# Patient Record
Sex: Male | Born: 1975 | Race: Black or African American | Hispanic: No | Marital: Single | Smoking: Former smoker
Health system: Southern US, Community
[De-identification: ages and names within clinical notes are randomized; demographics above are authoritative.]

## PROBLEM LIST (undated history)

## (undated) HISTORY — PX: FINGER FRACTURE SURGERY: SHX638

---

## 2012-08-28 ENCOUNTER — Other Ambulatory Visit: Payer: Self-pay | Admitting: Occupational Medicine

## 2012-08-28 ENCOUNTER — Ambulatory Visit: Payer: Self-pay

## 2012-08-28 DIAGNOSIS — R52 Pain, unspecified: Secondary | ICD-10-CM

## 2013-09-17 IMAGING — CR DG HAND COMPLETE 3+V*L*
3 series · 3 of 3 positions shown · non-contrast
Comparison: None.

CLINICAL DATA: Crush injury to middle and ring fingers.

LEFT HAND - COMPLETE 3+ VIEW

[view not recorded (1 of 3)]
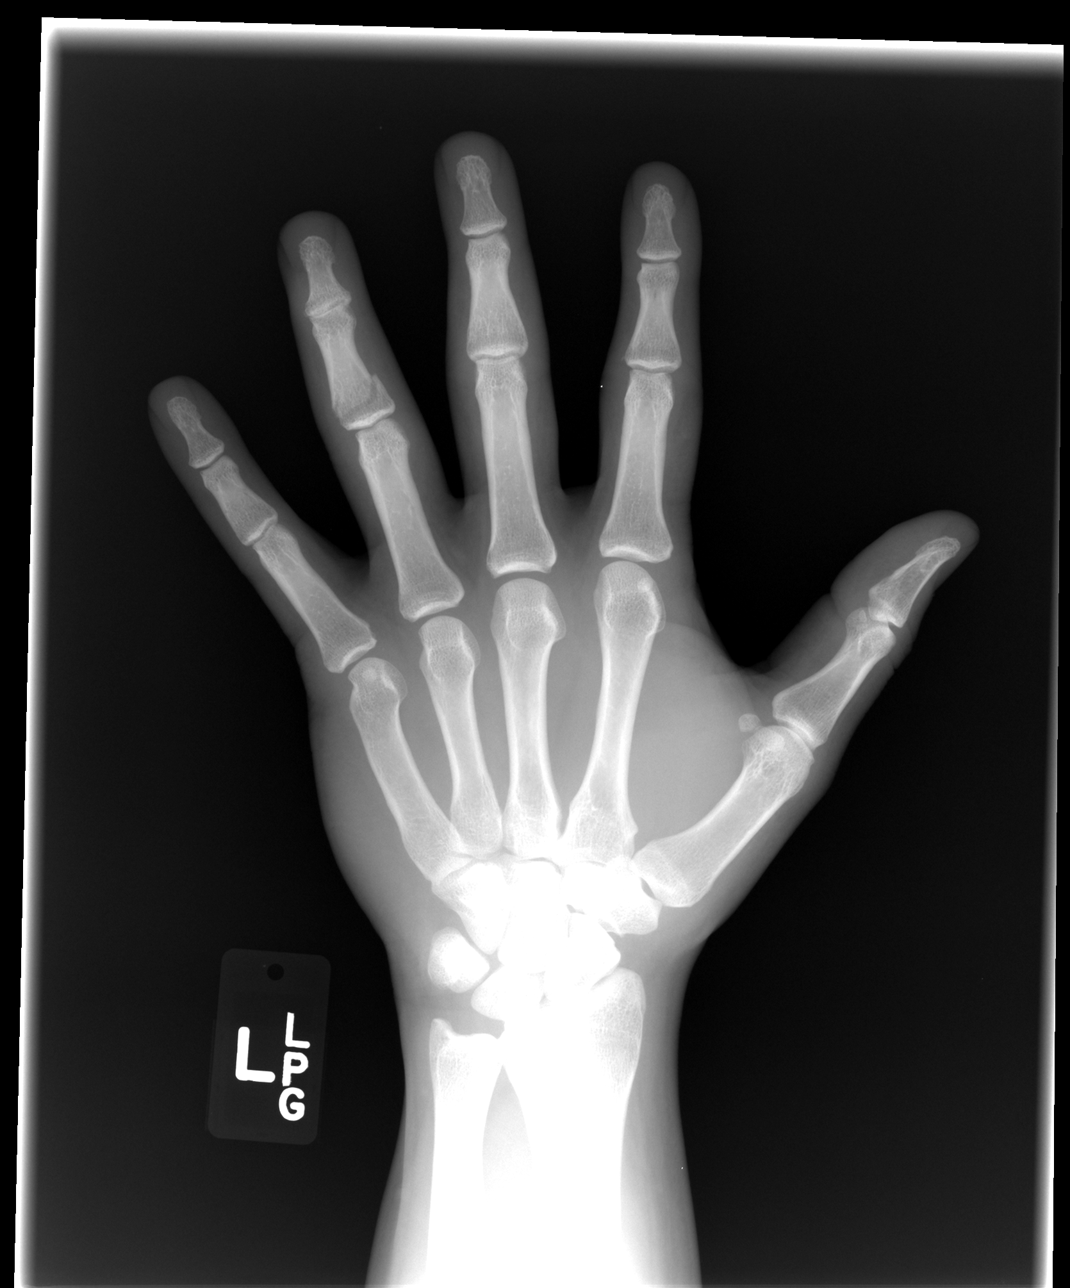

[view not recorded (2 of 3)]
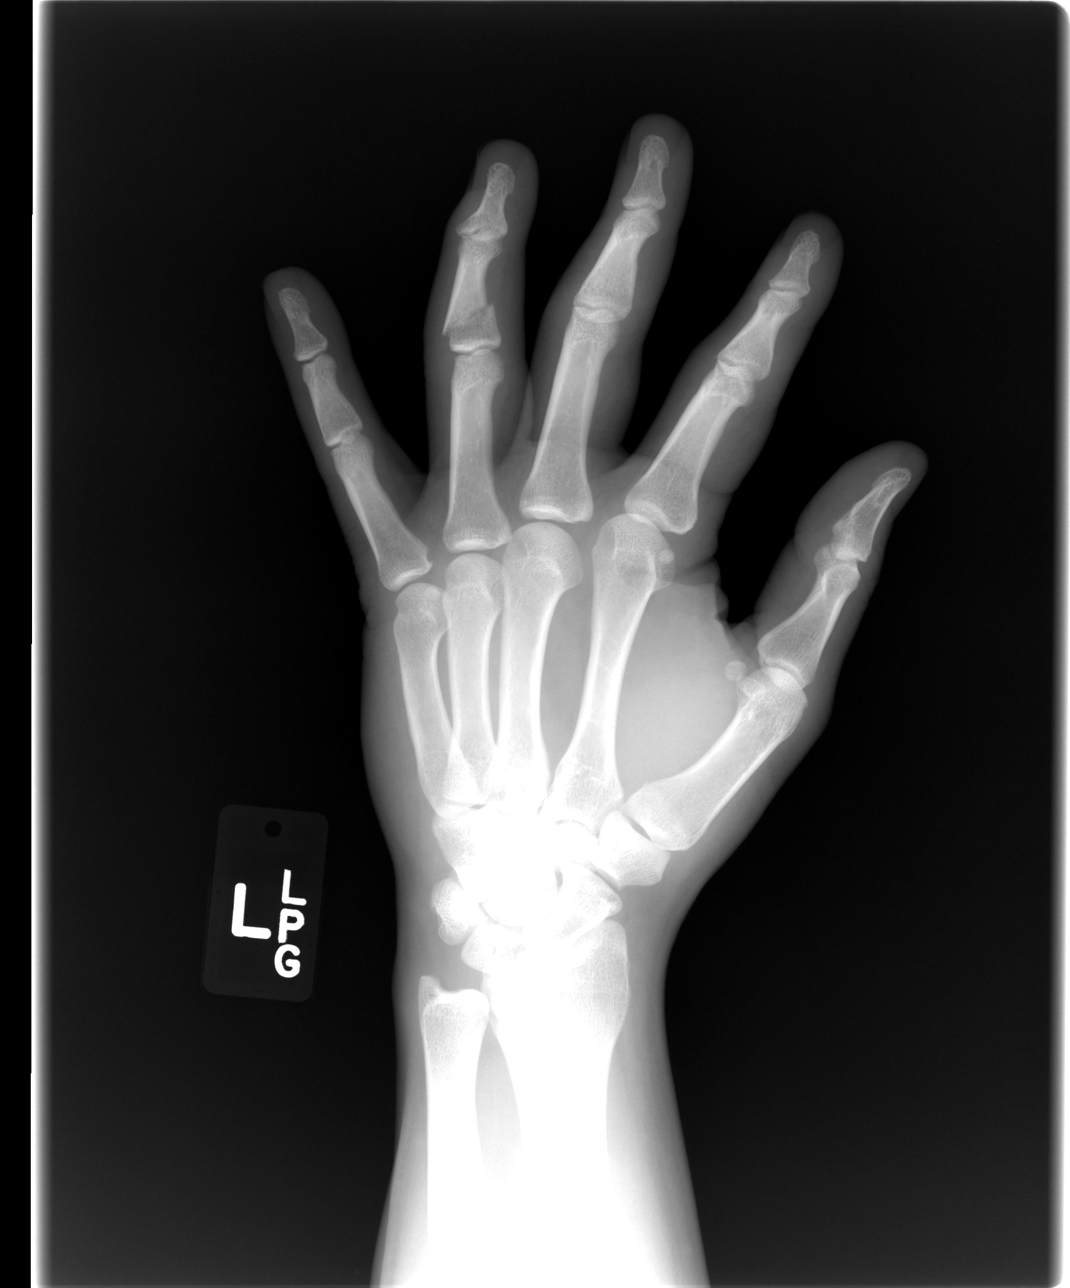

[view not recorded (3 of 3)]
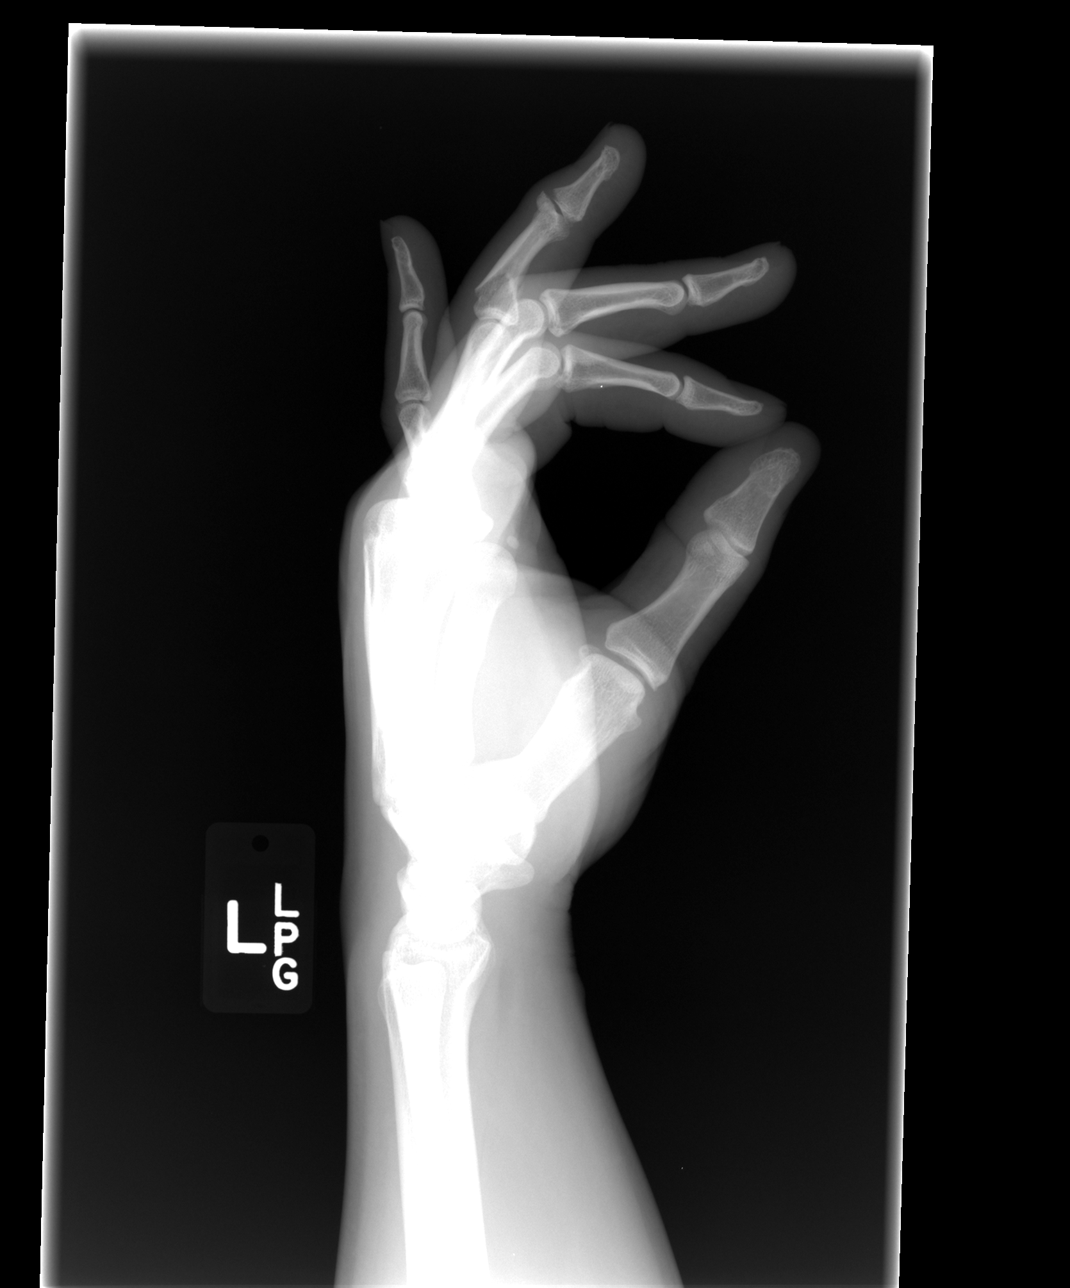

[3 of 3 positions shown; findings below may reference images not displayed]

FINDINGS: The patient has a fracture through the base of the middle
phalanx of the ring finger without intra-articular extension.  The
fracture demonstrates mild volar angulation and slight posterior
displacement.  Also seen is a dorsal plate avulsion fracture off
the distal phalanx of the ring finger.  No other acute bony or
joint abnormality is identified.
IMPRESSION: 1.  Fracture through the proximal metaphysis of the middle phalanx
of the ring finger without intra-articular extension.
2.  Dorsal plate avulsion fracture distal phalanx ring finger.

## 2015-02-28 DIAGNOSIS — A6000 Herpesviral infection of urogenital system, unspecified: Secondary | ICD-10-CM | POA: Insufficient documentation

## 2016-11-13 DIAGNOSIS — M25561 Pain in right knee: Secondary | ICD-10-CM | POA: Diagnosis not present

## 2016-11-13 DIAGNOSIS — M25571 Pain in right ankle and joints of right foot: Secondary | ICD-10-CM | POA: Diagnosis not present

## 2018-03-15 DIAGNOSIS — Z114 Encounter for screening for human immunodeficiency virus [HIV]: Secondary | ICD-10-CM | POA: Diagnosis not present

## 2018-03-15 DIAGNOSIS — Z113 Encounter for screening for infections with a predominantly sexual mode of transmission: Secondary | ICD-10-CM | POA: Diagnosis not present

## 2018-04-10 DIAGNOSIS — Z0000 Encounter for general adult medical examination without abnormal findings: Secondary | ICD-10-CM | POA: Diagnosis not present

## 2018-04-10 DIAGNOSIS — Z125 Encounter for screening for malignant neoplasm of prostate: Secondary | ICD-10-CM | POA: Diagnosis not present

## 2018-04-10 DIAGNOSIS — Z6833 Body mass index (BMI) 33.0-33.9, adult: Secondary | ICD-10-CM | POA: Diagnosis not present

## 2018-04-10 DIAGNOSIS — Z1389 Encounter for screening for other disorder: Secondary | ICD-10-CM | POA: Diagnosis not present

## 2021-07-13 ENCOUNTER — Encounter: Payer: Self-pay | Admitting: Nurse Practitioner

## 2021-07-13 ENCOUNTER — Ambulatory Visit: Payer: BC Managed Care – PPO | Admitting: Nurse Practitioner

## 2021-07-13 VITALS — BP 122/86 | HR 79 | Temp 98.4°F | Ht 67.6 in | Wt 219.0 lb

## 2021-07-13 DIAGNOSIS — Z23 Encounter for immunization: Secondary | ICD-10-CM

## 2021-07-13 DIAGNOSIS — Z13228 Encounter for screening for other metabolic disorders: Secondary | ICD-10-CM

## 2021-07-13 DIAGNOSIS — Z1159 Encounter for screening for other viral diseases: Secondary | ICD-10-CM

## 2021-07-13 DIAGNOSIS — Z0000 Encounter for general adult medical examination without abnormal findings: Secondary | ICD-10-CM | POA: Diagnosis not present

## 2021-07-13 DIAGNOSIS — M8560 Other cyst of bone, unspecified site: Secondary | ICD-10-CM | POA: Diagnosis not present

## 2021-07-13 DIAGNOSIS — Z7689 Persons encountering health services in other specified circumstances: Secondary | ICD-10-CM | POA: Diagnosis not present

## 2021-07-13 DIAGNOSIS — Z114 Encounter for screening for human immunodeficiency virus [HIV]: Secondary | ICD-10-CM

## 2021-07-13 DIAGNOSIS — E6609 Other obesity due to excess calories: Secondary | ICD-10-CM

## 2021-07-13 DIAGNOSIS — E559 Vitamin D deficiency, unspecified: Secondary | ICD-10-CM

## 2021-07-13 DIAGNOSIS — Z6833 Body mass index (BMI) 33.0-33.9, adult: Secondary | ICD-10-CM

## 2021-07-13 NOTE — Progress Notes (Signed)
I,Tianna Badgett,acting as a Education administrator for [employer], NP.,have documented all relevant documentation on the behalf of [employer], NP,as directed by  Bary Castilla, NP while in the presence of Bary Castilla, NP.  This visit occurred during the SARS-CoV-2 public health emergency.  Safety protocols were in place, including screening questions prior to the visit, additional usage of staff PPE, and extensive cleaning of exam room while observing appropriate contact time as indicated for disinfecting solutions.  Subjective:     Patient ID: Jason Smith , male    DOB: Oct 16, 1975 , 45 y.o.   MRN: [id number]   Chief Complaint  Patient presents with   Establish Care         HPI  Patient is here to establish care.  He has been to a urgent care. He is not taking any medications. He is unsure if he has any medical conditions.  Drink or smoke: smokes mariajuana once in while. He is drinking on occasion Sexually active: yes  Diet: Does not eat a lot of fast food. Drinks a lot of water  Exercise: He is not doing much but would like to.  He would like to loose weight. He wants to become more active.  He works in Company secretary.      No past medical history on file.   Family History  Problem Relation Age of Onset   Hypertension Mother    Asthma Brother    Hypertension Maternal Grandmother    Alzheimer's disease Maternal Grandmother    Diabetes Maternal Grandmother    Diabetes Paternal Grandmother    Hypertension Paternal Grandmother     No current outpatient medications on file.   No Known Allergies   Men's preventive visit. Patient Health Questionnaire (PHQ-2) is  Flowsheet Row Office Visit from 07/13/2021 in [facility]  PHQ-2 Total Score 0     . Patient is on a not on a diet diet. Marital status: Single. Relevant history for alcohol use is:  Social History   Substance and Sexual Activity  Alcohol Use Not  Currently  . Relevant history for tobacco use is: occasional marajuana use  Social History   Tobacco Use  Smoking Status Former   Types: Cigarettes   Quit date: 06/27/2021   Years since quitting: 0.0  Smokeless Tobacco Never  .   Review of Systems  Constitutional: Negative.  Negative for diaphoresis and fever.  HENT: Negative.  Negative for congestion.   Eyes: Negative.   Respiratory: Negative.  Negative for cough, shortness of breath and wheezing.   Cardiovascular: Negative.  Negative for chest pain and palpitations.  Gastrointestinal: Negative.  Negative for abdominal distention.  Endocrine: Negative.   Genitourinary:  Positive for urgency. Negative for difficulty urinating and frequency.  Musculoskeletal: Negative.        Small cyst to right rib cage   Skin: Negative.   Allergic/Immunologic: Negative.   Neurological: Negative.  Negative for headaches.  Hematological: Negative.   Psychiatric/Behavioral: Negative.      Today's Vitals   07/13/21 0855  BP: 122/86  Pulse: 79  Temp: 98.4 F (36.9 C)  TempSrc: Oral  Weight: 219 lb (99.3 kg)  Height: 5' 7.6" (1.717 m)   Body mass index is 33.69 kg/m.  Wt Readings from Last 3 Encounters:  07/13/21 219 lb (99.3 kg)    Objective:  Physical Exam Vitals and nursing note reviewed.  Constitutional:      Appearance: Normal appearance. He is obese.  HENT:     Head: Normocephalic and atraumatic.     Right Ear: Tympanic membrane, ear canal and external ear normal. There is no impacted cerumen.     Left Ear: Tympanic membrane, ear canal and external ear normal. There is no impacted cerumen.     Nose: Nose normal. No congestion or rhinorrhea.     Mouth/Throat:     Mouth: Mucous membranes are moist.     Pharynx: Oropharynx is clear.  Eyes:     Extraocular Movements: Extraocular movements intact.     Conjunctiva/sclera: Conjunctivae normal.     Pupils: Pupils are equal, round, and reactive to light.  Cardiovascular:      Rate and Rhythm: Normal rate and regular rhythm.     Pulses: Normal pulses.     Heart sounds: Normal heart sounds. No murmur heard. Pulmonary:     Effort: Pulmonary effort is normal. No respiratory distress.     Breath sounds: Normal breath sounds. No wheezing.  Chest:  Breasts:    Right: Normal. No swelling, bleeding, inverted nipple, mass or nipple discharge.     Left: Normal. No swelling, bleeding, inverted nipple, mass or nipple discharge.  Abdominal:     General: Abdomen is flat. Bowel sounds are normal. There is no distension.     Palpations: Abdomen is soft.     Tenderness: There is no abdominal tenderness. There is no guarding.  Genitourinary:    Comments: Deferred  Musculoskeletal:        General: Normal range of motion.     Cervical back: Normal range of motion and neck supple.  Skin:    General: Skin is warm and dry.     Comments: Cyst to right lower rib cage area. No pain upon palpitation.   Neurological:     General: No focal deficit present.     Mental Status: He is alert and oriented to person, place, and time.  Psychiatric:        Mood and Affect: Mood normal.        Behavior: Behavior normal.        Assessment And Plan:    1. Establishing care with new doctor, encounter for --Patient is here to establish care. Martin Majestic over patient medical, family, social and surgical history. -Reviewed with patient their medications and any allergies  -Reviewed with patient their sexual orientation, drug/tobacco and alcohol use -Dicussed any new concerns with patient  -recommended patient comes in for a physical exam and complete blood work.  -Educated patient about the importance of annual screenings and immunizations.  -Advised patient to eat a healthy diet along with exercise for atleast 30-45 min atleast 4-5 days of the week.  - CBC - CMP14+EGFR  2. Encounter for screening for metabolic disorder -Will assess for any metabolic disorders.  - Hemoglobin A1c - Lipid  panel  3. Vitamin D deficiency -Will check and supplement if needed. Advised patient to spend atleast 15 min. Daily in sunlight.  - VITAMIN D 25 Hydroxy (Vit-D Deficiency, Fractures)  4. Cyst of bone -Small cyst to the right side of the right lower rib cage  -Will get xray view  - DG Chest 2 View; Future  5. Screening for HIV without presence of risk factors - HIV Antibody (routine testing w rflx)  6. Need for Tdap vaccination - Tdap vaccine greater than or equal to 7yo IM  7. Need for influenza vaccination - Flu Vaccine QUAD 6+ mos PF IM (Fluarix Quad PF)  8. Encounter  for hepatitis C screening test for low risk patient - Hepatitis C antibody  9. Class 1 obesity due to excess calories without serious comorbidity with body mass index (BMI) of 33.0 to 33.9 in adult Advised patient on a healthy diet including avoiding fast food and red meats. Increase the intake of lean meats including grilled chicken and [place].  Drink a lot of water. Decrease intake of fatty foods. Exercise for 30-45 min. 4-5 a week to decrease the risk of cardiac event.   The patient was encouraged to call or send a message through [place] for any questions or concerns.   Follow up: if symptoms persist or do not get better.   Side effects and appropriate use of all the medication(s) were discussed with the patient today. Patient advised to use the medication(s) as directed by their healthcare provider. The patient was encouraged to read, review, and understand all associated package inserts and contact our office with any questions or concerns. The patient accepts the risks of the treatment plan and had an opportunity to ask questions.   Staying healthy and adopting a healthy lifestyle for your overall health is important. You should eat 7 or more servings of fruits and vegetables per day. You should drink plenty of water to keep yourself hydrated and your kidneys healthy. This includes about 65-80+ fluid ounces of  water. Limit your intake of animal fats especially for elevated cholesterol. Avoid highly processed food and limit your salt intake if you have hypertension. Avoid foods high in saturated/Trans fats. Along with a healthy diet it is also very important to maintain time for yourself to maintain a healthy mental health with low stress levels. You should get atleast 150 min of moderate intensity exercise weekly for a healthy heart. Along with eating right and exercising, aim for at least 7-9 hours of sleep daily.  Eat more whole grains which includes barley, wheat berries, oats, brown rice and whole wheat pasta. Use healthy plant oils which include olive, soy, corn, sunflower and peanut. Limit your caffeine and sugary drinks. Limit your intake of fast foods. Limit milk and dairy products to one or two daily servings.   Patient was given opportunity to ask questions. Patient verbalized understanding of the plan and was able to repeat key elements of the plan. All questions were answered to their satisfaction.  Raman Jupiter Kabir, DNP   I, Raman Amada Hallisey have reviewed all documentation for this visit. The documentation on 11/25/20 for the exam, diagnosis, procedures, and orders are all accurate and complete.    THE PATIENT IS ENCOURAGED TO PRACTICE SOCIAL DISTANCING DUE TO THE COVID-19 PANDEMIC.

## 2021-07-13 NOTE — Patient Instructions (Signed)

## 2021-07-14 ENCOUNTER — Other Ambulatory Visit: Payer: Self-pay | Admitting: Nurse Practitioner

## 2021-07-14 LAB — HEPATITIS C ANTIBODY: Hep C Virus Ab: <0.1 s/co ratio (ref 0.0–0.9)

## 2021-07-14 LAB — CMP14+EGFR
ALT: 13 IU/L (ref 0–44)
AST: 17 IU/L (ref 0–40)
Albumin/Globulin Ratio: 2.2 (ref 1.2–2.2)
Albumin: 4.9 g/dL (ref 4.0–5.0)
Alkaline Phosphatase: 57 IU/L (ref 44–121)
BUN/Creatinine Ratio: 10 (ref 9–20)
BUN: 11 mg/dL (ref 6–24)
Bilirubin Total: 0.8 mg/dL (ref 0.0–1.2)
CO2: 27 mmol/L (ref 20–29)
Calcium: 10.1 mg/dL (ref 8.7–10.2)
Chloride: 103 mmol/L (ref 96–106)
Creatinine, Ser: 1.14 mg/dL (ref 0.76–1.27)
Globulin, Total: 2.2 g/dL (ref 1.5–4.5)
Glucose: 128 mg/dL — ABNORMAL HIGH (ref 70–99)
Potassium: 4.5 mmol/L (ref 3.5–5.2)
Sodium: 142 mmol/L (ref 134–144)
Total Protein: 7.1 g/dL (ref 6.0–8.5)
eGFR: 82 mL/min/{1.73_m2} (ref >59)

## 2021-07-14 LAB — LIPID PANEL
Chol/HDL Ratio: 3.7 ratio (ref 0.0–5.0)
Cholesterol, Total: 197 mg/dL (ref 100–199)
HDL: 53 mg/dL (ref >39)
LDL Chol Calc (NIH): 127 mg/dL — ABNORMAL HIGH (ref 0–99)
Triglycerides: 95 mg/dL (ref 0–149)
VLDL Cholesterol Cal: 17 mg/dL (ref 5–40)

## 2021-07-14 LAB — CBC
Hematocrit: 43.4 % (ref 37.5–51.0)
Hemoglobin: 14.8 g/dL (ref 13.0–17.7)
MCH: 28.7 pg (ref 26.6–33.0)
MCHC: 34.1 g/dL (ref 31.5–35.7)
MCV: 84 fL (ref 79–97)
Platelets: 278 10*3/uL (ref 150–450)
RBC: 5.16 x10E6/uL (ref 4.14–5.80)
RDW: 12.8 % (ref 11.6–15.4)
WBC: 6.3 10*3/uL (ref 3.4–10.8)

## 2021-07-14 LAB — HEMOGLOBIN A1C
Est. average glucose Bld gHb Est-mCnc: 134 mg/dL
Hgb A1c MFr Bld: 6.3 % — ABNORMAL HIGH (ref 4.8–5.6)

## 2021-07-14 LAB — VITAMIN D 25 HYDROXY (VIT D DEFICIENCY, FRACTURES): Vit D, 25-Hydroxy: 11.3 ng/mL — ABNORMAL LOW (ref 30.0–100.0)

## 2021-07-14 LAB — HIV ANTIBODY (ROUTINE TESTING W REFLEX): HIV Screen 4th Generation wRfx: NONREACTIVE

## 2021-07-14 MED ORDER — VITAMIN D (ERGOCALCIFEROL) 1.25 MG (50000 UNIT) PO CAPS: ORAL_CAPSULE | ORAL | 0 refills | Status: DC

## 2021-09-07 DIAGNOSIS — H52203 Unspecified astigmatism, bilateral: Secondary | ICD-10-CM | POA: Diagnosis not present

## 2022-02-06 DIAGNOSIS — M5137 Other intervertebral disc degeneration, lumbosacral region: Secondary | ICD-10-CM | POA: Diagnosis not present

## 2022-02-06 DIAGNOSIS — M9903 Segmental and somatic dysfunction of lumbar region: Secondary | ICD-10-CM | POA: Diagnosis not present

## 2022-02-06 DIAGNOSIS — M9904 Segmental and somatic dysfunction of sacral region: Secondary | ICD-10-CM | POA: Diagnosis not present

## 2022-02-06 DIAGNOSIS — M5386 Other specified dorsopathies, lumbar region: Secondary | ICD-10-CM | POA: Diagnosis not present

## 2022-02-06 DIAGNOSIS — M9905 Segmental and somatic dysfunction of pelvic region: Secondary | ICD-10-CM | POA: Diagnosis not present

## 2022-02-06 DIAGNOSIS — M9902 Segmental and somatic dysfunction of thoracic region: Secondary | ICD-10-CM | POA: Diagnosis not present

## 2022-02-06 DIAGNOSIS — M5417 Radiculopathy, lumbosacral region: Secondary | ICD-10-CM | POA: Diagnosis not present

## 2022-02-13 DIAGNOSIS — M9903 Segmental and somatic dysfunction of lumbar region: Secondary | ICD-10-CM | POA: Diagnosis not present

## 2022-02-13 DIAGNOSIS — M5137 Other intervertebral disc degeneration, lumbosacral region: Secondary | ICD-10-CM | POA: Diagnosis not present

## 2022-02-13 DIAGNOSIS — M9905 Segmental and somatic dysfunction of pelvic region: Secondary | ICD-10-CM | POA: Diagnosis not present

## 2022-02-13 DIAGNOSIS — M9904 Segmental and somatic dysfunction of sacral region: Secondary | ICD-10-CM | POA: Diagnosis not present

## 2022-02-16 DIAGNOSIS — M9904 Segmental and somatic dysfunction of sacral region: Secondary | ICD-10-CM | POA: Diagnosis not present

## 2022-02-16 DIAGNOSIS — M9905 Segmental and somatic dysfunction of pelvic region: Secondary | ICD-10-CM | POA: Diagnosis not present

## 2022-02-16 DIAGNOSIS — M5137 Other intervertebral disc degeneration, lumbosacral region: Secondary | ICD-10-CM | POA: Diagnosis not present

## 2022-02-16 DIAGNOSIS — M9903 Segmental and somatic dysfunction of lumbar region: Secondary | ICD-10-CM | POA: Diagnosis not present

## 2022-02-22 DIAGNOSIS — M9903 Segmental and somatic dysfunction of lumbar region: Secondary | ICD-10-CM | POA: Diagnosis not present

## 2022-02-22 DIAGNOSIS — M9905 Segmental and somatic dysfunction of pelvic region: Secondary | ICD-10-CM | POA: Diagnosis not present

## 2022-02-22 DIAGNOSIS — M5137 Other intervertebral disc degeneration, lumbosacral region: Secondary | ICD-10-CM | POA: Diagnosis not present

## 2022-02-22 DIAGNOSIS — M9904 Segmental and somatic dysfunction of sacral region: Secondary | ICD-10-CM | POA: Diagnosis not present

## 2022-03-02 DIAGNOSIS — M9903 Segmental and somatic dysfunction of lumbar region: Secondary | ICD-10-CM | POA: Diagnosis not present

## 2022-03-02 DIAGNOSIS — M9904 Segmental and somatic dysfunction of sacral region: Secondary | ICD-10-CM | POA: Diagnosis not present

## 2022-03-02 DIAGNOSIS — M5137 Other intervertebral disc degeneration, lumbosacral region: Secondary | ICD-10-CM | POA: Diagnosis not present

## 2022-03-02 DIAGNOSIS — M9905 Segmental and somatic dysfunction of pelvic region: Secondary | ICD-10-CM | POA: Diagnosis not present

## 2022-03-08 DIAGNOSIS — M9903 Segmental and somatic dysfunction of lumbar region: Secondary | ICD-10-CM | POA: Diagnosis not present

## 2022-03-08 DIAGNOSIS — M9905 Segmental and somatic dysfunction of pelvic region: Secondary | ICD-10-CM | POA: Diagnosis not present

## 2022-03-08 DIAGNOSIS — M5137 Other intervertebral disc degeneration, lumbosacral region: Secondary | ICD-10-CM | POA: Diagnosis not present

## 2022-03-08 DIAGNOSIS — M9904 Segmental and somatic dysfunction of sacral region: Secondary | ICD-10-CM | POA: Diagnosis not present

## 2022-03-13 DIAGNOSIS — M9904 Segmental and somatic dysfunction of sacral region: Secondary | ICD-10-CM | POA: Diagnosis not present

## 2022-03-13 DIAGNOSIS — M9905 Segmental and somatic dysfunction of pelvic region: Secondary | ICD-10-CM | POA: Diagnosis not present

## 2022-03-13 DIAGNOSIS — M5137 Other intervertebral disc degeneration, lumbosacral region: Secondary | ICD-10-CM | POA: Diagnosis not present

## 2022-03-13 DIAGNOSIS — M9903 Segmental and somatic dysfunction of lumbar region: Secondary | ICD-10-CM | POA: Diagnosis not present

## 2022-03-15 DIAGNOSIS — M9904 Segmental and somatic dysfunction of sacral region: Secondary | ICD-10-CM | POA: Diagnosis not present

## 2022-03-15 DIAGNOSIS — M5386 Other specified dorsopathies, lumbar region: Secondary | ICD-10-CM | POA: Diagnosis not present

## 2022-03-15 DIAGNOSIS — M5417 Radiculopathy, lumbosacral region: Secondary | ICD-10-CM | POA: Diagnosis not present

## 2022-03-15 DIAGNOSIS — M9905 Segmental and somatic dysfunction of pelvic region: Secondary | ICD-10-CM | POA: Diagnosis not present

## 2022-03-16 ENCOUNTER — Ambulatory Visit (INDEPENDENT_AMBULATORY_CARE_PROVIDER_SITE_OTHER): Payer: BC Managed Care – PPO | Admitting: Podiatry

## 2022-03-16 ENCOUNTER — Encounter: Payer: Self-pay | Admitting: Podiatry

## 2022-03-16 DIAGNOSIS — B351 Tinea unguium: Secondary | ICD-10-CM | POA: Diagnosis not present

## 2022-03-16 NOTE — Progress Notes (Signed)
Subjective:   Patient ID: Jason Smith, male   DOB: 46 y.o.   MRN: [id number]   HPI Chief Complaint  Patient presents with   Nail Problem     L pinky toe nail split, painful, sensitive, tender    46 year old male presents with above concerns.  He has been dealing with this for years. He tries to cut it himself but still causing issues.  No swelling redness or any drainage but the area is tender with pressure.   Review of Systems  All other systems reviewed and are negative.  No past medical history on file.   Current Outpatient Medications:    Vitamin D, Ergocalciferol, (DRISDOL) 1.25 MG (50000 UNIT) CAPS capsule, Take 1 capsule by mouth on Tues and 1 capsule on Fridays., Disp: 24 capsule, Rfl: 0  No Known Allergies         Objective:  Physical Exam  General: AAO x3, NAD  Dermatological: Left 5th toenail is thick, discolored with brown discoloration. It is split down the middle of the toenail. No edema, erythema, or signs of infection.   Vascular: Dorsalis Pedis artery and Posterior Tibial artery pedal pulses are 2/4 bilateral with immedate capillary fill time. There is no pain with calf compression, swelling, warmth, erythema.   Neruologic: Grossly intact via light touch bilateral.   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 Muscular strength 5/5 in all groups tested bilateral.  Gait: Unassisted, Nonantalgic.       Assessment:   Onychomycosis, onychodystrophy fifth digit toenail     Plan:  -Treatment options discussed including all alternatives, risks, and complications. -Etiology of symptoms were discussed -We discussed different treatment options including topical versus oral as well as alternative treatments including total nail removal.  After discussion I sharply debride the nail with any complications and sent this for culture, pathology to [facility] labs.  Will await the results of the culture before proceeding with further treatment at this time.  He is  in agreement with this.  Vivi Barrack DPM

## 2022-03-21 DIAGNOSIS — M9905 Segmental and somatic dysfunction of pelvic region: Secondary | ICD-10-CM | POA: Diagnosis not present

## 2022-03-21 DIAGNOSIS — M5417 Radiculopathy, lumbosacral region: Secondary | ICD-10-CM | POA: Diagnosis not present

## 2022-03-21 DIAGNOSIS — M5386 Other specified dorsopathies, lumbar region: Secondary | ICD-10-CM | POA: Diagnosis not present

## 2022-03-21 DIAGNOSIS — M9904 Segmental and somatic dysfunction of sacral region: Secondary | ICD-10-CM | POA: Diagnosis not present

## 2022-03-22 DIAGNOSIS — M9903 Segmental and somatic dysfunction of lumbar region: Secondary | ICD-10-CM | POA: Diagnosis not present

## 2022-03-22 DIAGNOSIS — M9904 Segmental and somatic dysfunction of sacral region: Secondary | ICD-10-CM | POA: Diagnosis not present

## 2022-03-22 DIAGNOSIS — M9905 Segmental and somatic dysfunction of pelvic region: Secondary | ICD-10-CM | POA: Diagnosis not present

## 2022-03-22 DIAGNOSIS — M5137 Other intervertebral disc degeneration, lumbosacral region: Secondary | ICD-10-CM | POA: Diagnosis not present

## 2022-03-23 DIAGNOSIS — M9904 Segmental and somatic dysfunction of sacral region: Secondary | ICD-10-CM | POA: Diagnosis not present

## 2022-03-23 DIAGNOSIS — M5386 Other specified dorsopathies, lumbar region: Secondary | ICD-10-CM | POA: Diagnosis not present

## 2022-03-23 DIAGNOSIS — M5417 Radiculopathy, lumbosacral region: Secondary | ICD-10-CM | POA: Diagnosis not present

## 2022-03-23 DIAGNOSIS — M9905 Segmental and somatic dysfunction of pelvic region: Secondary | ICD-10-CM | POA: Diagnosis not present

## 2022-03-27 DIAGNOSIS — M9904 Segmental and somatic dysfunction of sacral region: Secondary | ICD-10-CM | POA: Diagnosis not present

## 2022-03-27 DIAGNOSIS — M5386 Other specified dorsopathies, lumbar region: Secondary | ICD-10-CM | POA: Diagnosis not present

## 2022-03-27 DIAGNOSIS — M9905 Segmental and somatic dysfunction of pelvic region: Secondary | ICD-10-CM | POA: Diagnosis not present

## 2022-03-27 DIAGNOSIS — M5417 Radiculopathy, lumbosacral region: Secondary | ICD-10-CM | POA: Diagnosis not present

## 2022-03-29 DIAGNOSIS — M9905 Segmental and somatic dysfunction of pelvic region: Secondary | ICD-10-CM | POA: Diagnosis not present

## 2022-03-29 DIAGNOSIS — M9904 Segmental and somatic dysfunction of sacral region: Secondary | ICD-10-CM | POA: Diagnosis not present

## 2022-03-29 DIAGNOSIS — M5417 Radiculopathy, lumbosacral region: Secondary | ICD-10-CM | POA: Diagnosis not present

## 2022-03-29 DIAGNOSIS — M5386 Other specified dorsopathies, lumbar region: Secondary | ICD-10-CM | POA: Diagnosis not present

## 2022-03-30 DIAGNOSIS — M9903 Segmental and somatic dysfunction of lumbar region: Secondary | ICD-10-CM | POA: Diagnosis not present

## 2022-03-30 DIAGNOSIS — M9904 Segmental and somatic dysfunction of sacral region: Secondary | ICD-10-CM | POA: Diagnosis not present

## 2022-03-30 DIAGNOSIS — M9902 Segmental and somatic dysfunction of thoracic region: Secondary | ICD-10-CM | POA: Diagnosis not present

## 2022-03-30 DIAGNOSIS — M5137 Other intervertebral disc degeneration, lumbosacral region: Secondary | ICD-10-CM | POA: Diagnosis not present

## 2022-03-30 DIAGNOSIS — M9905 Segmental and somatic dysfunction of pelvic region: Secondary | ICD-10-CM | POA: Diagnosis not present

## 2022-04-03 DIAGNOSIS — M5417 Radiculopathy, lumbosacral region: Secondary | ICD-10-CM | POA: Diagnosis not present

## 2022-04-03 DIAGNOSIS — M9904 Segmental and somatic dysfunction of sacral region: Secondary | ICD-10-CM | POA: Diagnosis not present

## 2022-04-03 DIAGNOSIS — M5386 Other specified dorsopathies, lumbar region: Secondary | ICD-10-CM | POA: Diagnosis not present

## 2022-04-03 DIAGNOSIS — M9905 Segmental and somatic dysfunction of pelvic region: Secondary | ICD-10-CM | POA: Diagnosis not present

## 2022-04-11 DIAGNOSIS — M5386 Other specified dorsopathies, lumbar region: Secondary | ICD-10-CM | POA: Diagnosis not present

## 2022-04-11 DIAGNOSIS — M9905 Segmental and somatic dysfunction of pelvic region: Secondary | ICD-10-CM | POA: Diagnosis not present

## 2022-04-11 DIAGNOSIS — M5417 Radiculopathy, lumbosacral region: Secondary | ICD-10-CM | POA: Diagnosis not present

## 2022-04-11 DIAGNOSIS — M9904 Segmental and somatic dysfunction of sacral region: Secondary | ICD-10-CM | POA: Diagnosis not present

## 2022-04-18 DIAGNOSIS — M5386 Other specified dorsopathies, lumbar region: Secondary | ICD-10-CM | POA: Diagnosis not present

## 2022-04-18 DIAGNOSIS — M9905 Segmental and somatic dysfunction of pelvic region: Secondary | ICD-10-CM | POA: Diagnosis not present

## 2022-04-18 DIAGNOSIS — M5417 Radiculopathy, lumbosacral region: Secondary | ICD-10-CM | POA: Diagnosis not present

## 2022-04-18 DIAGNOSIS — M9904 Segmental and somatic dysfunction of sacral region: Secondary | ICD-10-CM | POA: Diagnosis not present

## 2022-04-27 DIAGNOSIS — M5417 Radiculopathy, lumbosacral region: Secondary | ICD-10-CM | POA: Diagnosis not present

## 2022-04-27 DIAGNOSIS — M5386 Other specified dorsopathies, lumbar region: Secondary | ICD-10-CM | POA: Diagnosis not present

## 2022-04-27 DIAGNOSIS — M9904 Segmental and somatic dysfunction of sacral region: Secondary | ICD-10-CM | POA: Diagnosis not present

## 2022-04-27 DIAGNOSIS — M9905 Segmental and somatic dysfunction of pelvic region: Secondary | ICD-10-CM | POA: Diagnosis not present

## 2022-05-23 ENCOUNTER — Telehealth: Payer: Self-pay | Admitting: Podiatry

## 2022-05-23 ENCOUNTER — Other Ambulatory Visit: Payer: Self-pay | Admitting: Podiatry

## 2022-05-23 DIAGNOSIS — Z79899 Other long term (current) drug therapy: Secondary | ICD-10-CM

## 2022-05-23 NOTE — Telephone Encounter (Signed)
This patient was seen on 03/16/2022 and was wondering about the results. He asked that someone give him a call tomorrow 05/24/2022, to follow up.

## 2022-05-24 NOTE — Telephone Encounter (Signed)
Noted  

## 2022-07-31 ENCOUNTER — Encounter: Payer: BC Managed Care – PPO | Admitting: Nurse Practitioner

## 2022-09-20 DIAGNOSIS — M2550 Pain in unspecified joint: Secondary | ICD-10-CM | POA: Insufficient documentation

## 2022-09-20 DIAGNOSIS — M791 Myalgia, unspecified site: Secondary | ICD-10-CM | POA: Insufficient documentation

## 2022-09-21 ENCOUNTER — Encounter: Payer: Self-pay | Admitting: Nurse Practitioner

## 2022-09-21 ENCOUNTER — Ambulatory Visit (INDEPENDENT_AMBULATORY_CARE_PROVIDER_SITE_OTHER): Payer: BC Managed Care – PPO | Admitting: Nurse Practitioner

## 2022-09-21 ENCOUNTER — Other Ambulatory Visit (HOSPITAL_COMMUNITY)
Admission: RE | Admit: 2022-09-21 | Discharge: 2022-09-21 | Disposition: A | Discharge status: Home / Self Care | Payer: BC Managed Care – PPO | Source: Ambulatory Visit | Attending: Nurse Practitioner | Admitting: Nurse Practitioner

## 2022-09-21 VITALS — BP 138/88 | HR 84 | Temp 98.6°F | Ht 68.0 in | Wt 214.0 lb

## 2022-09-21 DIAGNOSIS — E6609 Other obesity due to excess calories: Secondary | ICD-10-CM

## 2022-09-21 DIAGNOSIS — Z114 Encounter for screening for human immunodeficiency virus [HIV]: Secondary | ICD-10-CM

## 2022-09-21 DIAGNOSIS — Z1159 Encounter for screening for other viral diseases: Secondary | ICD-10-CM

## 2022-09-21 DIAGNOSIS — Z0000 Encounter for general adult medical examination without abnormal findings: Secondary | ICD-10-CM | POA: Diagnosis not present

## 2022-09-21 DIAGNOSIS — Z8639 Personal history of other endocrine, nutritional and metabolic disease: Secondary | ICD-10-CM | POA: Diagnosis not present

## 2022-09-21 DIAGNOSIS — E559 Vitamin D deficiency, unspecified: Secondary | ICD-10-CM

## 2022-09-21 DIAGNOSIS — Z6832 Body mass index (BMI) 32.0-32.9, adult: Secondary | ICD-10-CM | POA: Diagnosis not present

## 2022-09-21 DIAGNOSIS — Z113 Encounter for screening for infections with a predominantly sexual mode of transmission: Secondary | ICD-10-CM

## 2022-09-21 DIAGNOSIS — Z0001 Encounter for general adult medical examination with abnormal findings: Secondary | ICD-10-CM

## 2022-09-21 NOTE — Progress Notes (Signed)
I,Sheena H Holbrook,acting as a Education administrator for Minette Brine, FNP.,have documented all relevant documentation on the behalf of Minette Brine, FNP,as directed by  Minette Brine, FNP while in the presence of Minette Brine, [place].    Subjective:     Patient ID: Jason Smith , male    DOB: Oct 02, 1975 , 47 y.o.   MRN: [id number]   Chief Complaint  Patient presents with   Annual Exam    HPI  Here for HM.   Wt Readings from Last 3 Encounters: 09/21/22 : 214 lb (97.1 kg) 07/13/21 : 219 lb (99.3 kg)  He is smoking intermittently not daily or every week. Last time was 7-8 days ago. He is not taking any vitamins. He is having an increase in phlegm. He is drinking 3-5 bottles of water a day. When he is fasting will drink more water. He will only drink water and no solid foods for 3 days.      History reviewed. No pertinent past medical history.   Family History  Problem Relation Age of Onset   Hypertension Mother    Asthma Brother    Hypertension Maternal Grandmother    Alzheimer's disease Maternal Grandmother    Diabetes Maternal Grandmother    Diabetes Paternal Grandmother    Hypertension Paternal Grandmother     No current outpatient medications on file.   No Known Allergies   Men's preventive visit. Patient Health Questionnaire (PHQ-2) is  [place] Office Visit from 09/21/2022 in [facility]  PHQ-2 Total Score 0     Patient is on a Regular diet; does not eat pork and does not eat a lot of red meat. Exercising - 3 days a week for about 1-1.5 hours plan to increase to daily exercise. Marital status: Single. Relevant history for alcohol use is:  Social History   Substance and Sexual Activity  Alcohol Use Not Currently   Relevant history for tobacco use is:  Social History   Tobacco Use  Smoking Status Former   Types: Cigarettes   Quit date: 06/27/2021   Years since quitting: 1.2  Smokeless Tobacco Never  .   Review of Systems   Constitutional: Negative.   HENT: Negative.    Eyes: Negative.   Respiratory: Negative.    Cardiovascular: Negative.   Gastrointestinal: Negative.   Endocrine: Negative.   Genitourinary: Negative.   Musculoskeletal: Negative.   Allergic/Immunologic: Negative.   Neurological: Negative.   Hematological: Negative.   Psychiatric/Behavioral: Negative.       Today's Vitals   09/21/22 1459  BP: 138/88  Pulse: 84  Temp: 98.6 F (37 C)  TempSrc: Oral  SpO2: 97%  Weight: 214 lb (97.1 kg)  Height: 5\' 8"  (1.727 m)   Body mass index is 32.54 kg/m.   Objective:  Physical Exam Vitals reviewed.  Constitutional:      Appearance: Normal appearance. He is obese.  HENT:     Head: Normocephalic and atraumatic.     Right Ear: Tympanic membrane, ear canal and external ear normal. There is no impacted cerumen.     Left Ear: Tympanic membrane, ear canal and external ear normal. There is no impacted cerumen.     Nose: Nose normal.     Mouth/Throat:     Mouth: Mucous membranes are moist.  Cardiovascular:     Rate and Rhythm: Normal rate and regular rhythm.     Pulses: Normal pulses.     Heart sounds: Normal heart sounds. No murmur heard.  Pulmonary:     Effort: Pulmonary effort is normal. No respiratory distress.     Breath sounds: Normal breath sounds.  Abdominal:     General: Abdomen is flat. Bowel sounds are normal. There is no distension.     Palpations: Abdomen is soft.  Genitourinary:    Prostate: Normal.     Rectum: Guaiac result negative.  Musculoskeletal:        General: Normal range of motion.     Cervical back: Normal range of motion and neck supple.  Skin:    General: Skin is warm.     Capillary Refill: Capillary refill takes less than 2 seconds.  Neurological:     General: No focal deficit present.     Mental Status: He is alert and oriented to person, place, and time.     Cranial Nerves: No cranial nerve deficit.     Motor: No weakness.  Psychiatric:        Mood  and Affect: Mood normal.        Behavior: Behavior normal.        Thought Content: Thought content normal.        Judgment: Judgment normal.         Assessment And Plan:    1. Encounter for general medical examination Behavior modifications discussed and diet history reviewed.   Pt will continue to exercise regularly and modify diet with low GI, plant based foods and decrease intake of processed foods.  Recommend intake of daily multivitamin, Vitamin D, and calcium.  Recommend for preventive screenings, as well as recommend immunizations that include influenza, TDAP - CBC - CMP14+EGFR - Lipid panel  2. History of elevated glucose - Hemoglobin A1c  3. Encounter for HIV (human immunodeficiency virus) test - HIV Antibody (routine testing w rflx)  4. Encounter for hepatitis C screening test for low risk patient Will check Hepatitis C screening due to recent recommendations to screen all adults 18 years and older - Hepatitis C antibody  5. Screening for STD (sexually transmitted disease) - Hepatitis B surface antigen - RPR - Urine cytology ancillary only - HSV 1 and 2 Ab, IgG  6. BMI 32.0-32.9,adult She is encouraged to strive for BMI less than 30 to decrease cardiac risk. Advised to aim for at least 150 minutes of exercise per week. - Hemoglobin A1c  7. Vitamin D deficiency Will check vitamin D level and supplement as needed.    Also encouraged to spend 15 minutes in the sun daily.  - VITAMIN D 25 Hydroxy (Vit-D Deficiency, Fractures)    Patient was given opportunity to ask questions. Patient verbalized understanding of the plan and was able to repeat key elements of the plan. All questions were answered to their satisfaction.   Minette Brine, FNP   I, Minette Brine, FNP, have reviewed all documentation for this visit. The documentation on 09/21/22 for the exam, diagnosis, procedures, and orders are all accurate and complete.   THE PATIENT IS ENCOURAGED TO PRACTICE  SOCIAL DISTANCING DUE TO THE COVID-19 PANDEMIC.

## 2022-09-22 LAB — HEPATITIS B SURFACE ANTIGEN: Hepatitis B Surface Ag: NEGATIVE

## 2022-09-22 LAB — HIV ANTIBODY (ROUTINE TESTING W REFLEX): HIV Screen 4th Generation wRfx: NONREACTIVE

## 2022-09-22 LAB — LIPID PANEL
Chol/HDL Ratio: 3.2 ratio (ref 0.0–5.0)
Cholesterol, Total: 168 mg/dL (ref 100–199)
HDL: 52 mg/dL (ref >39)
LDL Chol Calc (NIH): 101 mg/dL — ABNORMAL HIGH (ref 0–99)
Triglycerides: 80 mg/dL (ref 0–149)
VLDL Cholesterol Cal: 15 mg/dL (ref 5–40)

## 2022-09-22 LAB — CMP14+EGFR
ALT: 15 IU/L (ref 0–44)
AST: 17 IU/L (ref 0–40)
Albumin/Globulin Ratio: 2.5 — ABNORMAL HIGH (ref 1.2–2.2)
Albumin: 4.9 g/dL (ref 4.1–5.1)
Alkaline Phosphatase: 60 IU/L (ref 44–121)
BUN/Creatinine Ratio: 9 (ref 9–20)
BUN: 11 mg/dL (ref 6–24)
Bilirubin Total: 1.1 mg/dL (ref 0.0–1.2)
CO2: 22 mmol/L (ref 20–29)
Calcium: 9.9 mg/dL (ref 8.7–10.2)
Chloride: 100 mmol/L (ref 96–106)
Creatinine, Ser: 1.16 mg/dL (ref 0.76–1.27)
Globulin, Total: 2 g/dL (ref 1.5–4.5)
Glucose: 110 mg/dL — ABNORMAL HIGH (ref 70–99)
Potassium: 4.5 mmol/L (ref 3.5–5.2)
Sodium: 140 mmol/L (ref 134–144)
Total Protein: 6.9 g/dL (ref 6.0–8.5)
eGFR: 80 mL/min/1.73

## 2022-09-22 LAB — CBC
Hematocrit: 44.5 % (ref 37.5–51.0)
Hemoglobin: 15.2 g/dL (ref 13.0–17.7)
MCH: 28.1 pg (ref 26.6–33.0)
MCHC: 34.2 g/dL (ref 31.5–35.7)
MCV: 82 fL (ref 79–97)
Platelets: 293 10*3/uL (ref 150–450)
RBC: 5.41 x10E6/uL (ref 4.14–5.80)
RDW: 13.3 % (ref 11.6–15.4)
WBC: 5.7 10*3/uL (ref 3.4–10.8)

## 2022-09-22 LAB — HEMOGLOBIN A1C
Est. average glucose Bld gHb Est-mCnc: 140 mg/dL
Hgb A1c MFr Bld: 6.5 % — ABNORMAL HIGH (ref 4.8–5.6)

## 2022-09-22 LAB — HSV 1 AND 2 AB, IGG
HSV 1 Glycoprotein G Ab, IgG: <0.91 index (ref 0.00–0.90)
HSV 2 IgG, Type Spec: 7.21 index — ABNORMAL HIGH (ref 0.00–0.90)

## 2022-09-22 LAB — RPR: RPR Ser Ql: NONREACTIVE

## 2022-09-22 LAB — HEPATITIS C ANTIBODY: Hep C Virus Ab: NONREACTIVE

## 2022-09-22 LAB — VITAMIN D 25 HYDROXY (VIT D DEFICIENCY, FRACTURES): Vit D, 25-Hydroxy: 12 ng/mL — ABNORMAL LOW (ref 30.0–100.0)

## 2022-09-26 LAB — URINE CYTOLOGY ANCILLARY ONLY
Bacterial Vaginitis-Urine: NEGATIVE
Candida Urine: NEGATIVE
Chlamydia: NEGATIVE
Comment: NEGATIVE
Comment: NEGATIVE
Comment: NORMAL
Neisseria Gonorrhea: NEGATIVE
Trichomonas: NEGATIVE

## 2022-10-05 DIAGNOSIS — Z6832 Body mass index (BMI) 32.0-32.9, adult: Secondary | ICD-10-CM | POA: Insufficient documentation

## 2022-10-05 DIAGNOSIS — E559 Vitamin D deficiency, unspecified: Secondary | ICD-10-CM | POA: Insufficient documentation

## 2022-10-05 MED ORDER — METFORMIN HCL 500 MG PO TABS: 500.0000 mg | ORAL_TABLET | Freq: Two times a day (BID) | ORAL | 2 refills | Status: AC

## 2022-10-05 MED ORDER — VITAMIN D (ERGOCALCIFEROL) 1.25 MG (50000 UNIT) PO CAPS: 50000.0000 [IU] | ORAL_CAPSULE | ORAL | 1 refills | Status: AC

## 2023-03-26 ENCOUNTER — Ambulatory Visit: Payer: BC Managed Care – PPO | Admitting: Nurse Practitioner

## 2023-03-26 NOTE — Progress Notes (Deleted)
Madelaine Bhat, CMA,acting as a Neurosurgeon for Arnette Felts, FNP.,have documented all relevant documentation on the behalf of Arnette Felts, FNP,as directed by  Arnette Felts, FNP while in the presence of Arnette Felts, FNP.  Subjective:  Patient ID: Jason Smith , male    DOB: 12/13/1975 , 47 y.o.   MRN: [id number]  No chief complaint on file.   HPI  Patient presents today for a Pre DM follow up, Patient reports compliance with medications. Patient denies any chest pain, SOB, or headaches. Patient has no concerns today.     No past medical history on file.   Family History  Problem Relation Age of Onset  . Hypertension Mother   . Asthma Brother   . Hypertension Maternal Grandmother   . Alzheimer's disease Maternal Grandmother   . Diabetes Maternal Grandmother   . Diabetes Paternal Grandmother   . Hypertension Paternal Grandmother      Current Outpatient Medications:  .  metFORMIN (GLUCOPHAGE) 500 MG tablet, Take 1 tablet (500 mg total) by mouth 2 (two) times daily with a meal., Disp: 60 tablet, Rfl: 2 .  Vitamin D, Ergocalciferol, (DRISDOL) 1.25 MG (50000 UNIT) CAPS capsule, Take 1 capsule (50,000 Units total) by mouth 2 (two) times a week., Disp: 24 capsule, Rfl: 1   No Known Allergies   Review of Systems  Constitutional: Negative.   HENT: Negative.    Eyes: Negative.   Respiratory: Negative.    Cardiovascular: Negative.   Gastrointestinal: Negative.     There were no vitals filed for this visit. There is no height or weight on file to calculate BMI.  Wt Readings from Last 3 Encounters:  09/21/22 214 lb (97.1 kg)  07/13/21 219 lb (99.3 kg)    The 10-year ASCVD risk score (Arnett DK, et al., 2019) is: 4.4%   Values used to calculate the score:     Age: 36 years     Sex: Male     Is Non-Hispanic African American: Yes     Diabetic: No     Tobacco smoker: No     Systolic Blood Pressure: 138 mmHg     Is BP treated: No     HDL Cholesterol: 52 mg/dL     Total  Cholesterol: 168 mg/dL  Objective:  Physical Exam      Assessment And Plan:  Abnormal glucose    No follow-ups on file.  Patient was given opportunity to ask questions. Patient verbalized understanding of the plan and was able to repeat key elements of the plan. All questions were answered to their satisfaction.    Jeanell Sparrow, FNP, have reviewed all documentation for this visit. The documentation on 03/26/23 for the exam, diagnosis, procedures, and orders are all accurate and complete.   IF YOU HAVE BEEN REFERRED TO A SPECIALIST, IT MAY TAKE 1-2 WEEKS TO SCHEDULE/PROCESS THE REFERRAL. IF YOU HAVE NOT HEARD FROM US/SPECIALIST IN TWO WEEKS, PLEASE GIVE [place] A CALL AT [phone] X [phone].

## 2023-04-03 DIAGNOSIS — M25559 Pain in unspecified hip: Secondary | ICD-10-CM | POA: Diagnosis not present

## 2023-05-15 DIAGNOSIS — F331 Major depressive disorder, recurrent, moderate: Secondary | ICD-10-CM | POA: Diagnosis not present

## 2023-08-16 DIAGNOSIS — R350 Frequency of micturition: Secondary | ICD-10-CM | POA: Diagnosis not present

## 2023-08-16 DIAGNOSIS — Z1211 Encounter for screening for malignant neoplasm of colon: Secondary | ICD-10-CM | POA: Diagnosis not present

## 2023-08-16 DIAGNOSIS — R5383 Other fatigue: Secondary | ICD-10-CM | POA: Diagnosis not present

## 2023-08-16 DIAGNOSIS — Z13228 Encounter for screening for other metabolic disorders: Secondary | ICD-10-CM | POA: Diagnosis not present

## 2023-08-16 DIAGNOSIS — N400 Enlarged prostate without lower urinary tract symptoms: Secondary | ICD-10-CM | POA: Diagnosis not present

## 2023-08-16 DIAGNOSIS — Z125 Encounter for screening for malignant neoplasm of prostate: Secondary | ICD-10-CM | POA: Diagnosis not present

## 2023-08-16 DIAGNOSIS — Z0000 Encounter for general adult medical examination without abnormal findings: Secondary | ICD-10-CM | POA: Diagnosis not present

## 2023-08-16 DIAGNOSIS — Z13220 Encounter for screening for lipoid disorders: Secondary | ICD-10-CM | POA: Diagnosis not present

## 2023-08-16 DIAGNOSIS — Z1321 Encounter for screening for nutritional disorder: Secondary | ICD-10-CM | POA: Diagnosis not present

## 2023-08-16 DIAGNOSIS — Z1329 Encounter for screening for other suspected endocrine disorder: Secondary | ICD-10-CM | POA: Diagnosis not present

## 2023-08-16 DIAGNOSIS — Z131 Encounter for screening for diabetes mellitus: Secondary | ICD-10-CM | POA: Diagnosis not present

## 2023-08-16 DIAGNOSIS — E119 Type 2 diabetes mellitus without complications: Secondary | ICD-10-CM | POA: Diagnosis not present

## 2023-08-16 DIAGNOSIS — E7800 Pure hypercholesterolemia, unspecified: Secondary | ICD-10-CM | POA: Diagnosis not present

## 2023-08-16 DIAGNOSIS — E782 Mixed hyperlipidemia: Secondary | ICD-10-CM | POA: Diagnosis not present

## 2023-08-16 DIAGNOSIS — R030 Elevated blood-pressure reading, without diagnosis of hypertension: Secondary | ICD-10-CM | POA: Diagnosis not present

## 2023-08-28 DIAGNOSIS — Z1211 Encounter for screening for malignant neoplasm of colon: Secondary | ICD-10-CM | POA: Diagnosis not present

## 2023-09-24 ENCOUNTER — Encounter: Payer: Self-pay | Admitting: Nurse Practitioner

## 2023-09-24 ENCOUNTER — Encounter: Payer: BC Managed Care – PPO | Admitting: Nurse Practitioner

## 2023-11-13 DIAGNOSIS — E782 Mixed hyperlipidemia: Secondary | ICD-10-CM | POA: Diagnosis not present

## 2023-12-12 DIAGNOSIS — R7301 Impaired fasting glucose: Secondary | ICD-10-CM | POA: Diagnosis not present

## 2023-12-12 DIAGNOSIS — E782 Mixed hyperlipidemia: Secondary | ICD-10-CM | POA: Diagnosis not present

## 2024-05-12 DIAGNOSIS — I10 Essential (primary) hypertension: Secondary | ICD-10-CM | POA: Diagnosis not present

## 2024-05-12 DIAGNOSIS — R7301 Impaired fasting glucose: Secondary | ICD-10-CM | POA: Diagnosis not present

## 2024-05-12 DIAGNOSIS — E782 Mixed hyperlipidemia: Secondary | ICD-10-CM | POA: Diagnosis not present

## 2024-05-12 DIAGNOSIS — Z6829 Body mass index (BMI) 29.0-29.9, adult: Secondary | ICD-10-CM | POA: Diagnosis not present

## 2024-05-12 DIAGNOSIS — R350 Frequency of micturition: Secondary | ICD-10-CM | POA: Diagnosis not present
